# Patient Record
Sex: Female | Born: 1990 | Race: White | Hispanic: No | Marital: Single | Smoking: Never smoker
Health system: Southern US, Community
[De-identification: ages and names within clinical notes are randomized; demographics above are authoritative.]

## PROBLEM LIST (undated history)

## (undated) DIAGNOSIS — K551 Chronic vascular disorders of intestine: Secondary | ICD-10-CM

## (undated) DIAGNOSIS — N289 Disorder of kidney and ureter, unspecified: Secondary | ICD-10-CM

## (undated) DIAGNOSIS — J45909 Unspecified asthma, uncomplicated: Secondary | ICD-10-CM

## (undated) DIAGNOSIS — E079 Disorder of thyroid, unspecified: Secondary | ICD-10-CM

## (undated) DIAGNOSIS — R000 Tachycardia, unspecified: Secondary | ICD-10-CM

## (undated) DIAGNOSIS — I871 Compression of vein: Secondary | ICD-10-CM

## (undated) DIAGNOSIS — I498 Other specified cardiac arrhythmias: Secondary | ICD-10-CM

## (undated) DIAGNOSIS — I951 Orthostatic hypotension: Secondary | ICD-10-CM

## (undated) DIAGNOSIS — R34 Anuria and oliguria: Secondary | ICD-10-CM

## (undated) DIAGNOSIS — N911 Secondary amenorrhea: Secondary | ICD-10-CM

## (undated) DIAGNOSIS — G629 Polyneuropathy, unspecified: Secondary | ICD-10-CM

## (undated) DIAGNOSIS — N9489 Other specified conditions associated with female genital organs and menstrual cycle: Secondary | ICD-10-CM

## (undated) DIAGNOSIS — D649 Anemia, unspecified: Secondary | ICD-10-CM

## (undated) DIAGNOSIS — G90A Postural orthostatic tachycardia syndrome (POTS): Secondary | ICD-10-CM

## (undated) HISTORY — PX: WISDOM TOOTH EXTRACTION: SHX21

## (undated) HISTORY — PX: GASTRIC BYPASS: SHX52

## (undated) HISTORY — PX: GASTROSTOMY W/ FEEDING TUBE: SUR642

## (undated) HISTORY — PX: VASCULAR SURGERY: SHX849

## (undated) HISTORY — PX: NASAL SINUS SURGERY: SHX719

## (undated) HISTORY — PX: CHOLECYSTECTOMY: SHX55

## (undated) HISTORY — PX: TONSILLECTOMY: SUR1361

---
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
## 2011-02-08 ENCOUNTER — Emergency Department (HOSPITAL_BASED_OUTPATIENT_CLINIC_OR_DEPARTMENT_OTHER)
Admission: EM | Admit: 2011-02-08 | Discharge: 2011-02-08 | Disposition: A | Discharge status: Home / Self Care | Payer: BC Managed Care – PPO | Attending: Emergency Medicine | Admitting: Emergency Medicine

## 2011-02-08 DIAGNOSIS — K625 Hemorrhage of anus and rectum: Secondary | ICD-10-CM | POA: Insufficient documentation

## 2011-02-08 DIAGNOSIS — N898 Other specified noninflammatory disorders of vagina: Secondary | ICD-10-CM | POA: Insufficient documentation

## 2011-02-08 DIAGNOSIS — J45909 Unspecified asthma, uncomplicated: Secondary | ICD-10-CM | POA: Insufficient documentation

## 2011-02-08 LAB — PROTIME-INR
INR: 1.15 (ref 0.00–1.49)
Prothrombin Time: 14.9 seconds (ref 11.6–15.2)

## 2011-02-08 LAB — CBC
MCH: 29 pg (ref 26.0–34.0)
MCHC: 33.5 g/dL (ref 30.0–36.0)
RDW: 13 % (ref 11.5–15.5)

## 2011-02-08 LAB — COMPREHENSIVE METABOLIC PANEL
ALT: 20 U/L (ref 0–35)
Alkaline Phosphatase: 148 U/L — ABNORMAL HIGH (ref 39–117)
BUN: 9 mg/dL (ref 6–23)
CO2: 18 mEq/L — ABNORMAL LOW (ref 19–32)
GFR calc Af Amer: >60 mL/min (ref >60)
GFR calc non Af Amer: >60 mL/min (ref >60)
Glucose, Bld: 78 mg/dL (ref 70–99)
Potassium: 3.4 mEq/L — ABNORMAL LOW (ref 3.5–5.1)
Sodium: 141 mEq/L (ref 135–145)
Total Protein: 7.2 g/dL (ref 6.0–8.3)

## 2011-02-08 LAB — URINALYSIS, ROUTINE W REFLEX MICROSCOPIC
Leukocytes, UA: NEGATIVE
Nitrite: NEGATIVE
Specific Gravity, Urine: 1.035 — ABNORMAL HIGH (ref 1.005–1.030)
Urobilinogen, UA: 1 mg/dL (ref 0.0–1.0)
pH: 6 (ref 5.0–8.0)

## 2011-02-08 LAB — URINE MICROSCOPIC-ADD ON

## 2011-02-08 LAB — DIFFERENTIAL
Basophils Absolute: 0 10*3/uL (ref 0.0–0.1)
Basophils Relative: 0 % (ref 0–1)
Eosinophils Relative: 0 % (ref 0–5)
Monocytes Absolute: 0.5 10*3/uL (ref 0.1–1.0)
Monocytes Relative: 7 % (ref 3–12)

## 2011-02-08 LAB — LIPASE, BLOOD: Lipase: 13 U/L (ref 11–59)

## 2011-02-08 LAB — WET PREP, GENITAL: Clue Cells Wet Prep HPF POC: NONE SEEN

## 2011-02-09 LAB — GC/CHLAMYDIA PROBE AMP, GENITAL: Chlamydia, DNA Probe: NEGATIVE

## 2011-04-17 ENCOUNTER — Telehealth (INDEPENDENT_AMBULATORY_CARE_PROVIDER_SITE_OTHER): Payer: Self-pay

## 2011-04-17 NOTE — Telephone Encounter (Signed)
Patients mom called and said he was in severe pain and thinks a stone may be stuck in duct. She said her GI doctors nurse told her to contact us. I told patient we need to be contacted by her GI doctor about needing to be seen. She went on to say that they think all her problems are mentally onset due to tramatic family happenings. Again I told her we will need to get a referral from them or office notes at least to evaluate her. The mother is in [place] and daughter is here in [place] so she is going on by what daughter is telling her over the phone. I told her to tell daughter to go to ER if pain gets worse.

## 2014-08-23 ENCOUNTER — Other Ambulatory Visit (HOSPITAL_COMMUNITY): Payer: Self-pay | Admitting: Family Medicine

## 2014-08-23 DIAGNOSIS — R0781 Pleurodynia: Secondary | ICD-10-CM

## 2014-08-24 ENCOUNTER — Encounter (HOSPITAL_COMMUNITY): Payer: Self-pay

## 2014-08-24 ENCOUNTER — Ambulatory Visit (HOSPITAL_COMMUNITY)
Admission: RE | Admit: 2014-08-24 | Discharge: 2014-08-24 | Disposition: A | Discharge status: Home / Self Care | Payer: Medicaid Other | Source: Ambulatory Visit | Attending: Family Medicine | Admitting: Family Medicine

## 2014-08-24 DIAGNOSIS — Z9884 Bariatric surgery status: Secondary | ICD-10-CM | POA: Insufficient documentation

## 2014-08-24 DIAGNOSIS — R0781 Pleurodynia: Secondary | ICD-10-CM | POA: Insufficient documentation

## 2014-08-24 DIAGNOSIS — Z86718 Personal history of other venous thrombosis and embolism: Secondary | ICD-10-CM | POA: Insufficient documentation

## 2014-08-24 HISTORY — DX: Unspecified asthma, uncomplicated: J45.909

## 2014-08-24 HISTORY — DX: Disorder of kidney and ureter, unspecified: N28.9

## 2014-08-24 MED ORDER — IOHEXOL 350 MG/ML SOLN
80.0000 mL | Freq: Once | INTRAVENOUS | Status: AC | PRN
  Administered 2014-08-24: 80 mL via INTRAVENOUS

## 2014-10-04 ENCOUNTER — Telehealth: Payer: Self-pay | Admitting: Oncology

## 2014-10-04 NOTE — Telephone Encounter (Signed)
pt confirmed appt on 10/21/14 at 1:30pm w/ Cathy Webb Dx:  porphyna and needs iron infusion tx Referring Dr. Ritta Slotaubenschmidt

## 2014-10-06 ENCOUNTER — Telehealth: Payer: Self-pay | Admitting: Oncology

## 2014-10-06 NOTE — Telephone Encounter (Signed)
Called office [place] to [place].  She will fax over office note and labs. 10/06/14 @ 1:40 pm  TG

## 2014-10-20 ENCOUNTER — Telehealth: Payer: Self-pay | Admitting: *Deleted

## 2014-10-20 NOTE — Telephone Encounter (Signed)
Called patient to remind her of tomorrow's new patient appt. Patient tates she wants to cancel, d/t she needs to get medicaid first. Transferred call to scheduling.

## 2014-10-21 ENCOUNTER — Other Ambulatory Visit: Payer: PRIVATE HEALTH INSURANCE

## 2014-10-21 ENCOUNTER — Ambulatory Visit: Payer: PRIVATE HEALTH INSURANCE | Admitting: Oncology

## 2014-10-21 ENCOUNTER — Ambulatory Visit: Payer: PRIVATE HEALTH INSURANCE

## 2014-11-10 ENCOUNTER — Telehealth: Payer: Self-pay | Admitting: Internal Medicine

## 2014-11-10 NOTE — Telephone Encounter (Signed)
CALLED PT AND SCHEDULED NEW PATIENT APPT.    MOHAMED 12/22/14 11 AM  DX: PORPHYRIA REFERRING: DR DAUBENSCHMIDT

## 2014-11-18 ENCOUNTER — Emergency Department (HOSPITAL_COMMUNITY)
Admission: EM | Admit: 2014-11-18 | Discharge: 2014-11-18 | Discharge status: Against Medical Advice | Payer: Medicaid Other | Attending: Emergency Medicine | Admitting: Emergency Medicine

## 2014-11-18 ENCOUNTER — Encounter (HOSPITAL_COMMUNITY): Payer: Self-pay | Admitting: Emergency Medicine

## 2014-11-18 DIAGNOSIS — R1011 Right upper quadrant pain: Secondary | ICD-10-CM | POA: Diagnosis present

## 2014-11-18 DIAGNOSIS — K625 Hemorrhage of anus and rectum: Secondary | ICD-10-CM | POA: Insufficient documentation

## 2014-11-18 DIAGNOSIS — J45909 Unspecified asthma, uncomplicated: Secondary | ICD-10-CM | POA: Insufficient documentation

## 2014-11-18 NOTE — ED Notes (Signed)
Patient was seen at [facility] yesterday for same symptoms-has scheduled CT scan, but has not gone, has not followed up with GI

## 2014-11-18 NOTE — ED Notes (Signed)
Per EMS-states right upper abdominal pain, rectal bleeding for 4 days

## 2014-12-22 ENCOUNTER — Ambulatory Visit: Payer: PRIVATE HEALTH INSURANCE | Admitting: Internal Medicine

## 2014-12-22 ENCOUNTER — Other Ambulatory Visit: Payer: Self-pay | Admitting: Internal Medicine

## 2014-12-22 ENCOUNTER — Other Ambulatory Visit: Payer: PRIVATE HEALTH INSURANCE

## 2014-12-22 ENCOUNTER — Ambulatory Visit: Payer: PRIVATE HEALTH INSURANCE

## 2014-12-22 DIAGNOSIS — Z8639 Personal history of other endocrine, nutritional and metabolic disease: Secondary | ICD-10-CM | POA: Insufficient documentation

## 2015-02-10 ENCOUNTER — Encounter (HOSPITAL_COMMUNITY): Payer: Self-pay

## 2015-02-10 ENCOUNTER — Emergency Department (HOSPITAL_COMMUNITY)
Admission: EM | Admit: 2015-02-10 | Discharge: 2015-02-10 | Disposition: A | Discharge status: Home / Self Care | Payer: Medicaid Other | Attending: Emergency Medicine | Admitting: Emergency Medicine

## 2015-02-10 DIAGNOSIS — Z8679 Personal history of other diseases of the circulatory system: Secondary | ICD-10-CM | POA: Insufficient documentation

## 2015-02-10 DIAGNOSIS — Y9289 Other specified places as the place of occurrence of the external cause: Secondary | ICD-10-CM | POA: Diagnosis not present

## 2015-02-10 DIAGNOSIS — X58XXXA Exposure to other specified factors, initial encounter: Secondary | ICD-10-CM | POA: Diagnosis not present

## 2015-02-10 DIAGNOSIS — J45909 Unspecified asthma, uncomplicated: Secondary | ICD-10-CM | POA: Insufficient documentation

## 2015-02-10 DIAGNOSIS — Z79899 Other long term (current) drug therapy: Secondary | ICD-10-CM | POA: Diagnosis not present

## 2015-02-10 DIAGNOSIS — Z7951 Long term (current) use of inhaled steroids: Secondary | ICD-10-CM | POA: Diagnosis not present

## 2015-02-10 DIAGNOSIS — T7840XA Allergy, unspecified, initial encounter: Secondary | ICD-10-CM | POA: Insufficient documentation

## 2015-02-10 DIAGNOSIS — Y9389 Activity, other specified: Secondary | ICD-10-CM | POA: Diagnosis not present

## 2015-02-10 DIAGNOSIS — Z792 Long term (current) use of antibiotics: Secondary | ICD-10-CM | POA: Diagnosis not present

## 2015-02-10 DIAGNOSIS — Z8639 Personal history of other endocrine, nutritional and metabolic disease: Secondary | ICD-10-CM | POA: Insufficient documentation

## 2015-02-10 DIAGNOSIS — Y998 Other external cause status: Secondary | ICD-10-CM | POA: Diagnosis not present

## 2015-02-10 DIAGNOSIS — T63444A Toxic effect of venom of bees, undetermined, initial encounter: Secondary | ICD-10-CM | POA: Insufficient documentation

## 2015-02-10 DIAGNOSIS — R21 Rash and other nonspecific skin eruption: Secondary | ICD-10-CM | POA: Diagnosis present

## 2015-02-10 DIAGNOSIS — Z8742 Personal history of other diseases of the female genital tract: Secondary | ICD-10-CM | POA: Insufficient documentation

## 2015-02-10 DIAGNOSIS — G629 Polyneuropathy, unspecified: Secondary | ICD-10-CM | POA: Diagnosis not present

## 2015-02-10 HISTORY — DX: Tachycardia, unspecified: R00.0

## 2015-02-10 HISTORY — DX: Polyneuropathy, unspecified: G62.9

## 2015-02-10 HISTORY — DX: Other specified cardiac arrhythmias: I49.8

## 2015-02-10 HISTORY — DX: Secondary amenorrhea: N91.1

## 2015-02-10 HISTORY — DX: Postural orthostatic tachycardia syndrome (POTS): G90.A

## 2015-02-10 HISTORY — DX: Disorder of thyroid, unspecified: E07.9

## 2015-02-10 HISTORY — DX: Anuria and oliguria: R34

## 2015-02-10 HISTORY — DX: Orthostatic hypotension: I95.1

## 2015-02-10 MED ORDER — EPINEPHRINE 0.3 MG/0.3ML IJ SOAJ: 0.3000 mg | Freq: Once | INTRAMUSCULAR | Status: AC

## 2015-02-10 NOTE — ED Notes (Signed)
According to EMS, pt was stung by bee and administered x2 doses of Epi Pen. Fire Dept administered 50mg  of Benadryl. Pt presents to ED Rm in no distress.

## 2015-02-10 NOTE — Discharge Instructions (Signed)

## 2015-02-10 NOTE — ED Notes (Signed)
Bed: FX83 Expected date:  Expected time:  Means of arrival:  Comments: EMS/bee sting/allergic

## 2015-02-10 NOTE — ED Provider Notes (Signed)
CSN: [id number]     Arrival date & time 02/10/15  1956 History   First MD Initiated Contact with Patient 02/10/15 2018     Chief Complaint  Patient presents with  . Insect Bite     (Consider location/radiation/quality/duration/timing/severity/associated sxs/prior Treatment) Patient is a 24 y.o. female presenting with allergic reaction. The history is provided by the patient. No language interpreter was used.  Allergic Reaction Presenting symptoms: itching, rash and swelling   Severity:  Severe Prior allergic episodes:  Insect allergies Context: insect bite/sting   Relieved by:  Antihistamines and epinephrine Worsened by:  Nothing tried Ineffective treatments:  None tried Pt reports she was stung by a bee.   Pt's friend gave her 2 shots of epi.  Fire dept gave pt 50 mg of benadryl.   Pt feels fine now  Past Medical History  Diagnosis Date  . Asthma   . Renal insufficiency   . Polyneuropathy   . Amenorrhea, secondary   . Oliguria   . Thyroid disease   . POTS (postural orthostatic tachycardia syndrome)    Past Surgical History  Procedure Laterality Date  . Gastric bypass     History reviewed. No pertinent family history. History  Substance Use Topics  . Smoking status: Never Smoker   . Smokeless tobacco: Not on file  . Alcohol Use: No   OB History    No data available     Review of Systems  Skin: Positive for itching and rash.  All other systems reviewed and are negative.     Allergies  Bee venom; Dexamethasone; Nsaids; Aspirin; and Adhesive  Home Medications   Prior to Admission medications   Medication Sig Start Date End Date Taking? Authorizing Provider  albuterol (PROVENTIL HFA;VENTOLIN HFA) 108 (90 BASE) MCG/ACT inhaler Inhale 1 puff into the lungs every 6 (six) hours as needed for wheezing or shortness of breath.  08/23/14 08/24/15 Yes Historical Provider, MD  B Complex Vitamins (VITAMIN-B COMPLEX) TABS Take 1 tablet by mouth daily.   Yes Historical  Provider, MD  cetirizine (ZYRTEC) 10 MG tablet Take 10 mg by mouth daily. 08/23/14  Yes Historical Provider, MD  cyanocobalamin (,VITAMIN B-12,) 1000 MCG/ML injection Inject 1,000 mcg into the muscle every 30 (thirty) days. 11/14/14  Yes Historical Provider, MD  diphenhydrAMINE (BENADRYL) 25 MG tablet Take 50 mg by mouth every 4 (four) hours as needed (liver enzymes).  08/23/13  Yes Historical Provider, MD  doxycycline (VIBRA-TABS) 100 MG tablet Take 100 mg by mouth daily. 04/24/14  Yes Historical Provider, MD  fluconazole (DIFLUCAN) 150 MG tablet Take 150 mg by mouth daily. 04/19/14  Yes Historical Provider, MD  fluticasone (FLONASE) 50 MCG/ACT nasal spray Place 1 spray into the nose daily. 08/23/14  Yes Historical Provider, MD  gabapentin (NEURONTIN) 800 MG tablet Take 800 mg by mouth 4 (four) times daily as needed (leg pain).  11/14/14 02/12/15 Yes Historical Provider, MD  HYDROcodone-acetaminophen (NORCO/VICODIN) 5-325 MG per tablet Take 1 tablet by mouth every 4 (four) hours as needed for moderate pain or severe pain.  12/31/14  Yes Historical Provider, MD  hydrOXYzine (ATARAX/VISTARIL) 10 MG tablet Take 10 mg by mouth 4 (four) times daily as needed for itching, anxiety or nausea.  08/23/14  Yes Historical Provider, MD  lactulose (CHRONULAC) 10 GM/15ML solution Take 20 g by mouth daily as needed for mild constipation or moderate constipation.  08/23/14  Yes Historical Provider, MD  Multiple Vitamin (THERA) TABS Take 1 tablet by mouth daily.  Yes Historical Provider, MD  mupirocin ointment (BACTROBAN) 2 % Apply 1 application topically as needed (skin infection).  09/19/13  Yes Historical Provider, MD  nitrofurantoin, macrocrystal-monohydrate, (MACROBID) 100 MG capsule Take 100 mg by mouth daily. 08/23/14  Yes Historical Provider, MD  ondansetron (ZOFRAN) 4 MG tablet Take 4 mg by mouth every 8 (eight) hours as needed for nausea or vomiting.  08/23/14  Yes Historical Provider, MD  Pancrelipase, Lip-Prot-Amyl, 24000 UNITS  CPEP Take 86,000 units of lipase by mouth 3 (three) times daily. 08/23/14  Yes Historical Provider, MD  promethazine (PHENERGAN) 25 MG suppository Place 25 mg rectally every 6 (six) hours as needed for nausea or vomiting.  08/23/14  Yes Historical Provider, MD  promethazine (PHENERGAN) 25 MG tablet Take 25 mg by mouth every 8 (eight) hours as needed for nausea or vomiting.  08/23/14  Yes Historical Provider, MD  promethazine (PHENERGAN) 6.25 MG/5ML syrup Take 6.25 mg by mouth 4 (four) times daily as needed for nausea or vomiting.  08/26/14  Yes Historical Provider, MD  Vitamin D, Ergocalciferol, (DRISDOL) 50000 UNITS CAPS capsule Take 50,000 Units by mouth every Wednesday. 10/09/12  Yes Historical Provider, MD  zolpidem (AMBIEN) 10 MG tablet Take 10 mg by mouth daily as needed for sleep.  11/14/14  Yes Historical Provider, MD   BP 110/67 mmHg  Pulse 85  Temp(Src) 98.5 F (36.9 C) (Oral)  Resp 12  SpO2 100% Physical Exam  Constitutional: She is oriented to person, place, and time. She appears well-developed and well-nourished.  HENT:  Head: Normocephalic and atraumatic.  Eyes: Conjunctivae and EOM are normal. Pupils are equal, round, and reactive to light.  Neck: Normal range of motion.  Cardiovascular: Normal rate and normal heart sounds.   Pulmonary/Chest: Effort normal.  Abdominal: Soft. She exhibits no distension.  Musculoskeletal: Normal range of motion.  Neurological: She is alert and oriented to person, place, and time.  Skin: Skin is warm. No rash noted. No erythema.  Psychiatric: She has a normal mood and affect.  Nursing note and vitals reviewed.   ED Course  Procedures (including critical care time) Labs Review Labs Reviewed - No data to display  Imaging Review No results found.   EKG Interpretation None      MDM   Pt observed x 3.5 hours.  Pt had epi 4 hours ago.   Pt advised to continue benadryl and given rx for refill on epi pen.   Final diagnoses:  Bee sting,  undetermined intent, initial encounter  Allergic reaction to bee sting, undetermined intent, initial encounter    avs    Elson Areas, PA-C 02/11/15 0115  Donnetta Hutching, MD 02/11/15 1556

## 2015-02-10 NOTE — ED Notes (Signed)
Pt requesting to be discharged. Pt states she feels much better and can swallow without any difficulty.

## 2015-05-16 ENCOUNTER — Emergency Department (HOSPITAL_BASED_OUTPATIENT_CLINIC_OR_DEPARTMENT_OTHER): Payer: Medicaid Other

## 2015-05-16 ENCOUNTER — Emergency Department (HOSPITAL_BASED_OUTPATIENT_CLINIC_OR_DEPARTMENT_OTHER)
Admission: EM | Admit: 2015-05-16 | Discharge: 2015-05-16 | Disposition: A | Discharge status: Other Acute Inpatient Hospital | Payer: Medicaid Other | Attending: Emergency Medicine | Admitting: Emergency Medicine

## 2015-05-16 ENCOUNTER — Encounter (HOSPITAL_BASED_OUTPATIENT_CLINIC_OR_DEPARTMENT_OTHER): Payer: Self-pay | Admitting: *Deleted

## 2015-05-16 DIAGNOSIS — G629 Polyneuropathy, unspecified: Secondary | ICD-10-CM | POA: Insufficient documentation

## 2015-05-16 DIAGNOSIS — J45909 Unspecified asthma, uncomplicated: Secondary | ICD-10-CM | POA: Insufficient documentation

## 2015-05-16 DIAGNOSIS — R000 Tachycardia, unspecified: Secondary | ICD-10-CM | POA: Insufficient documentation

## 2015-05-16 DIAGNOSIS — Z792 Long term (current) use of antibiotics: Secondary | ICD-10-CM | POA: Diagnosis not present

## 2015-05-16 DIAGNOSIS — Z8639 Personal history of other endocrine, nutritional and metabolic disease: Secondary | ICD-10-CM | POA: Insufficient documentation

## 2015-05-16 DIAGNOSIS — R519 Headache, unspecified: Secondary | ICD-10-CM

## 2015-05-16 DIAGNOSIS — Z7951 Long term (current) use of inhaled steroids: Secondary | ICD-10-CM | POA: Diagnosis not present

## 2015-05-16 DIAGNOSIS — Z8679 Personal history of other diseases of the circulatory system: Secondary | ICD-10-CM | POA: Insufficient documentation

## 2015-05-16 DIAGNOSIS — Z87448 Personal history of other diseases of urinary system: Secondary | ICD-10-CM | POA: Insufficient documentation

## 2015-05-16 DIAGNOSIS — R1084 Generalized abdominal pain: Secondary | ICD-10-CM | POA: Insufficient documentation

## 2015-05-16 DIAGNOSIS — R231 Pallor: Secondary | ICD-10-CM | POA: Diagnosis not present

## 2015-05-16 DIAGNOSIS — Z3202 Encounter for pregnancy test, result negative: Secondary | ICD-10-CM | POA: Diagnosis not present

## 2015-05-16 DIAGNOSIS — R0789 Other chest pain: Secondary | ICD-10-CM | POA: Diagnosis not present

## 2015-05-16 DIAGNOSIS — Z8742 Personal history of other diseases of the female genital tract: Secondary | ICD-10-CM | POA: Diagnosis not present

## 2015-05-16 DIAGNOSIS — Z79899 Other long term (current) drug therapy: Secondary | ICD-10-CM | POA: Diagnosis not present

## 2015-05-16 DIAGNOSIS — R51 Headache: Secondary | ICD-10-CM | POA: Insufficient documentation

## 2015-05-16 HISTORY — DX: Other specified conditions associated with female genital organs and menstrual cycle: N94.89

## 2015-05-16 HISTORY — DX: Compression of vein: I87.1

## 2015-05-16 HISTORY — DX: Anemia, unspecified: D64.9

## 2015-05-16 HISTORY — DX: Polyneuropathy, unspecified: G62.9

## 2015-05-16 HISTORY — DX: Unspecified porphyria: E80.20

## 2015-05-16 HISTORY — DX: Chronic vascular disorders of intestine: K55.1

## 2015-05-16 LAB — COMPREHENSIVE METABOLIC PANEL
ALT: 20 U/L (ref 14–54)
AST: 18 U/L (ref 15–41)
Albumin: 4.1 g/dL (ref 3.5–5.0)
Alkaline Phosphatase: 85 U/L (ref 38–126)
Anion gap: 6 (ref 5–15)
BUN: 11 mg/dL (ref 6–20)
CHLORIDE: 108 mmol/L (ref 101–111)
CO2: 25 mmol/L (ref 22–32)
CREATININE: 0.88 mg/dL (ref 0.44–1.00)
Calcium: 8.6 mg/dL — ABNORMAL LOW (ref 8.9–10.3)
GFR calc Af Amer: >60 mL/min (ref >60)
Glucose, Bld: 87 mg/dL (ref 65–99)
POTASSIUM: 3.9 mmol/L (ref 3.5–5.1)
SODIUM: 139 mmol/L (ref 135–145)
Total Bilirubin: 0.2 mg/dL — ABNORMAL LOW (ref 0.3–1.2)
Total Protein: 7.1 g/dL (ref 6.5–8.1)

## 2015-05-16 LAB — CBC WITH DIFFERENTIAL/PLATELET
BASOS ABS: 0.1 10*3/uL (ref 0.0–0.1)
Basophils Relative: 1 %
EOS ABS: 0.1 10*3/uL (ref 0.0–0.7)
EOS PCT: 2 %
HCT: 42.2 % (ref 36.0–46.0)
Hemoglobin: 13.4 g/dL (ref 12.0–15.0)
Lymphocytes Relative: 30 %
Lymphs Abs: 2.1 10*3/uL (ref 0.7–4.0)
MCH: 30.8 pg (ref 26.0–34.0)
MCHC: 31.8 g/dL (ref 30.0–36.0)
MCV: 97 fL (ref 78.0–100.0)
MONO ABS: 0.4 10*3/uL (ref 0.1–1.0)
Monocytes Relative: 5 %
Neutro Abs: 4.4 10*3/uL (ref 1.7–7.7)
Neutrophils Relative %: 62 %
PLATELETS: 261 10*3/uL (ref 150–400)
RBC: 4.35 MIL/uL (ref 3.87–5.11)
RDW: 12.9 % (ref 11.5–15.5)
WBC: 7 10*3/uL (ref 4.0–10.5)

## 2015-05-16 LAB — URINALYSIS, ROUTINE W REFLEX MICROSCOPIC
BILIRUBIN URINE: NEGATIVE
Glucose, UA: NEGATIVE mg/dL
Hgb urine dipstick: NEGATIVE
KETONES UR: NEGATIVE mg/dL
NITRITE: NEGATIVE
Protein, ur: NEGATIVE mg/dL
Specific Gravity, Urine: 1.025 (ref 1.005–1.030)
Urobilinogen, UA: 0.2 mg/dL (ref 0.0–1.0)
pH: 6 (ref 5.0–8.0)

## 2015-05-16 LAB — URINE MICROSCOPIC-ADD ON

## 2015-05-16 LAB — LIPASE, BLOOD: LIPASE: 18 U/L — AB (ref 22–51)

## 2015-05-16 LAB — PREGNANCY, URINE: PREG TEST UR: NEGATIVE

## 2015-05-16 LAB — TROPONIN I: Troponin I: <0.03 ng/mL (ref <0.031)

## 2015-05-16 LAB — BRAIN NATRIURETIC PEPTIDE: B Natriuretic Peptide: 21.6 pg/mL (ref 0.0–100.0)

## 2015-05-16 MED ORDER — MORPHINE SULFATE (PF) 4 MG/ML IV SOLN
4.0000 mg | Freq: Once | INTRAVENOUS | Status: AC
  Administered 2015-05-16: 4 mg via INTRAVENOUS
  Filled 2015-05-16: qty 1

## 2015-05-16 MED ORDER — HYDROMORPHONE HCL 1 MG/ML IJ SOLN
0.5000 mg | Freq: Once | INTRAMUSCULAR | Status: AC
  Administered 2015-05-16: 0.5 mg via INTRAVENOUS
  Filled 2015-05-16: qty 1

## 2015-05-16 MED ORDER — ONDANSETRON HCL 4 MG/2ML IJ SOLN
4.0000 mg | Freq: Four times a day (QID) | INTRAMUSCULAR | Status: DC | PRN
  Administered 2015-05-16: 4 mg via INTRAVENOUS
  Filled 2015-05-16: qty 2

## 2015-05-16 MED ORDER — SODIUM CHLORIDE 0.9 % IV BOLUS (SEPSIS)
1000.0000 mL | Freq: Once | INTRAVENOUS | Status: AC
  Administered 2015-05-16: 1000 mL via INTRAVENOUS

## 2015-05-16 MED ORDER — SODIUM CHLORIDE 0.9 % IV SOLN
INTRAVENOUS | Status: DC
  Administered 2015-05-16: 19:00:00 via INTRAVENOUS

## 2015-05-16 MED ORDER — MORPHINE SULFATE (PF) 4 MG/ML IV SOLN
4.0000 mg | INTRAVENOUS | Status: DC | PRN
  Administered 2015-05-16: 4 mg via INTRAVENOUS
  Filled 2015-05-16: qty 1

## 2015-05-16 NOTE — ED Notes (Signed)
Pt reports cramping in left hand and left side of her face x 20 minutes. States this is a new symptom for her. Greta Doom, EDPA notified

## 2015-05-16 NOTE — ED Provider Notes (Signed)
CSN: [id number]     Arrival date & time 05/16/15  1205 History   First MD Initiated Contact with Patient 05/16/15 1209     Chief Complaint  Patient presents with  . Abdominal Pain     (Consider location/radiation/quality/duration/timing/severity/associated sxs/prior Treatment) HPI  24 year old female with extensive past medical history including pots, thyroid disease, porphyria, asthma, renal insufficiency, polyneuropathy, amenorrhea, May-Thurner Syndrome who presents for evaluation of abdominal pain. Patient reports 2 weeks ago she noticed that both of her hands were blue. She was seen and evaluate for this and states that she was ruled out for blood clots in her chest and heart. Since then she has had generalized fatigue. For the past 2-3 days she developed upper abdominal pain which she described as a sharp and stabbing constant sensation as well as pressure in his chest. Symptoms worsen with walking, she endorse dyspnea on exertion, having cough productive with frothy sputum and occasional trace of blood. Reports decreased appetite, generalized fatigue, feeling nauseous without vomiting.  She reported symptoms moderate in severity. She reports feeling hot but denies any specific fever or chills. Her last menstrual period was 3 years ago. she reported taking Advil with minimal improvement. She did discussed the symptoms with her on-call hematologist 2 days ago and was recommended to come to the ER for further evaluation. 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 Also mentioned history of anemia requiring iron transfusion every 3 months however the last transfusion was 6 months ago. She is currently living [facility] and receiving care through [facility], and at [facility]. She has not established hematologist in town.      Past Medical History  Diagnosis Date  . Asthma   . Renal insufficiency   . Polyneuropathy   .  Amenorrhea, secondary   . Oliguria   . Thyroid disease   . POTS (postural orthostatic tachycardia syndrome)    Past Surgical History  Procedure Laterality Date  . Gastric bypass     No family history on file. Social History  Substance Use Topics  . Smoking status: Never Smoker   . Smokeless tobacco: None  . Alcohol Use: No   OB History    No data available     Review of Systems     Allergies  Bee venom; Dexamethasone; Nsaids; Aspirin; and Adhesive  Home Medications   Prior to Admission medications   Medication Sig Start Date End Date Taking? Authorizing Provider  albuterol (PROVENTIL HFA;VENTOLIN HFA) 108 (90 BASE) MCG/ACT inhaler Inhale 1 puff into the lungs every 6 (six) hours as needed for wheezing or shortness of breath.  08/23/14 08/24/15  Historical Provider, MD  B Complex Vitamins (VITAMIN-B COMPLEX) TABS Take 1 tablet by mouth daily.    Historical Provider, MD  cetirizine (ZYRTEC) 10 MG tablet Take 10 mg by mouth daily. 08/23/14   Historical Provider, MD  cyanocobalamin (,VITAMIN B-12,) 1000 MCG/ML injection Inject 1,000 mcg into the muscle every 30 (thirty) days. 11/14/14   Historical Provider, MD  diphenhydrAMINE (BENADRYL) 25 MG tablet Take 50 mg by mouth every 4 (four) hours as needed (liver enzymes).  08/23/13   Historical Provider, MD  doxycycline (VIBRA-TABS) 100 MG tablet Take 100 mg by mouth daily. 04/24/14   Historical Provider, MD  EPINEPHrine 0.3 mg/0.3 mL IJ SOAJ injection Inject 0.3 mLs (0.3 mg total) into the muscle once. 02/10/15   Elson Areas, PA-C  fluconazole (DIFLUCAN) 150 MG  tablet Take 150 mg by mouth daily. 04/19/14   Historical Provider, MD  fluticasone (FLONASE) 50 MCG/ACT nasal spray Place 1 spray into the nose daily. 08/23/14   Historical Provider, MD  gabapentin (NEURONTIN) 800 MG tablet Take 800 mg by mouth 4 (four) times daily as needed (leg pain).  11/14/14 02/12/15  Historical Provider, MD  HYDROcodone-acetaminophen (NORCO/VICODIN) 5-325 MG per tablet  Take 1 tablet by mouth every 4 (four) hours as needed for moderate pain or severe pain.  12/31/14   Historical Provider, MD  hydrOXYzine (ATARAX/VISTARIL) 10 MG tablet Take 10 mg by mouth 4 (four) times daily as needed for itching, anxiety or nausea.  08/23/14   Historical Provider, MD  lactulose (CHRONULAC) 10 GM/15ML solution Take 20 g by mouth daily as needed for mild constipation or moderate constipation.  08/23/14   Historical Provider, MD  Multiple Vitamin (THERA) TABS Take 1 tablet by mouth daily.    Historical Provider, MD  mupirocin ointment (BACTROBAN) 2 % Apply 1 application topically as needed (skin infection).  09/19/13   Historical Provider, MD  nitrofurantoin, macrocrystal-monohydrate, (MACROBID) 100 MG capsule Take 100 mg by mouth daily. 08/23/14   Historical Provider, MD  ondansetron (ZOFRAN) 4 MG tablet Take 4 mg by mouth every 8 (eight) hours as needed for nausea or vomiting.  08/23/14   Historical Provider, MD  Pancrelipase, Lip-Prot-Amyl, 24000 UNITS CPEP Take 86,000 units of lipase by mouth 3 (three) times daily. 08/23/14   Historical Provider, MD  promethazine (PHENERGAN) 25 MG suppository Place 25 mg rectally every 6 (six) hours as needed for nausea or vomiting.  08/23/14   Historical Provider, MD  promethazine (PHENERGAN) 25 MG tablet Take 25 mg by mouth every 8 (eight) hours as needed for nausea or vomiting.  08/23/14   Historical Provider, MD  promethazine (PHENERGAN) 6.25 MG/5ML syrup Take 6.25 mg by mouth 4 (four) times daily as needed for nausea or vomiting.  08/26/14   Historical Provider, MD  Vitamin D, Ergocalciferol, (DRISDOL) 50000 UNITS CAPS capsule Take 50,000 Units by mouth every Wednesday. 10/09/12   Historical Provider, MD  zolpidem (AMBIEN) 10 MG tablet Take 10 mg by mouth daily as needed for sleep.  11/14/14   Historical Provider, MD   BP 131/97 mmHg  Pulse 130  Temp(Src) 98.5 F (36.9 C) (Oral)  Resp 22  Ht  (1.626 m)  Wt 203 lb (92.08 kg)  BMI 34.83 kg/m2  SpO2  100% Physical Exam  Constitutional: She is oriented to person, place, and time. She appears well-developed and well-nourished. No distress.  Moderately obese Caucasian female appears to be in mild discomfort.   HENT:  Head: Atraumatic.  Mouth/Throat: Oropharynx is clear and moist.  Eyes: Conjunctivae and EOM are normal. Pupils are equal, round, and reactive to light.  Neck: Neck supple. No JVD present.  Cardiovascular: Intact distal pulses.   Pulses:      Radial pulses are 2+ on the right side, and 2+ on the left side.       Dorsalis pedis pulses are 2+ on the right side, and 2+ on the left side.  Tachycardia without murmurs rubs or gallops  Pulmonary/Chest: She exhibits tenderness (Mild diffuse chest wall tenderness.).  Tachypneic and tachycardic no wheezes rhonchi also rales heard  Abdominal: Soft. There is no tenderness.  Mild diffuse abdominal tenderness without focal point tenderness.  Musculoskeletal: She exhibits no edema.  Neurological: She is alert and oriented to person, place, and time. She has normal strength. No  cranial nerve deficit or sensory deficit. She displays a negative Romberg sign. GCS eye subscore is 4. GCS verbal subscore is 5. GCS motor subscore is 6.  Skin: No rash noted. There is pallor.  Psychiatric: She has a normal mood and affect.  Nursing note and vitals reviewed.   ED Course  Procedures (including critical care time)  Patient with multiple comorbidities along with history of coagulopathy presenting for upper abdominal pain, chest pain, and generalized fatigue. Recurrent sxs, with similar ER visit 2 weeks ago without acute finding.      2:37 PM Pt now report headache, with tingling sensation to R side of face and bilateral arms.  Has been c/o intermittent migraine headache for the past several day. Family concern for stroke.  WIll obtain head CT scan.  I have reviewed prior notes from Dr. Jolyne Loa who documented that he has not diagnosed her with  Porphyria at this time.  He is currently awaits further consultation with a Porphyria expect from [place].     2:46 PM Patient now reports subjective paresthesia to the right side of her face, as well as decreased sensation to bilateral upper and lower extremities. She also endorses sharp achy headache. Her labs are reassuring. No evidence of anemia with a hemoglobin of 13.4. Normal WBC therefore low suspicion for acute infection. Her electrolytes are reassuring. Normal lipase. Her EKG and troponins are unremarkable. BNP is normal. Her urine shows no signs of urinary tract infection a chest x-ray is normal. She has intact distal pulse to all 4 extremities.  3:57 PM Appreciate consultation from [facility] Dr. Jolyne Loa who has reviewed pt's chart.  He's unsure if pt has a confirmed diagnosis of Porphyria but given that pt is still having abd pain and symptomatic, the plan is to have pt transfer to [facility] for admission so additional work up can be perform.  Pt did had recent chest CTA < 2weeks ago for similar complaint without evidence of PE.  At this time she is not hypoxic and her vital sign is stable therefore pt is stable to transfer and additional advance imaging to r/o venous pathology can be determine by the admitting team.  Pt agrees with plan.  Will work on transferring protocol.  Care discussed with Dr. Donnald Garre.    4:26 PM Appreciate consultation from [facility] hospitalist DR. Lucianne Muss.  Plan to have pt admit for further work up of acute porphyria, which may include dextro loading, IVF, pain control. Pt will be transferred to [facility].    Labs Review Labs Reviewed  URINALYSIS, ROUTINE W REFLEX MICROSCOPIC (NOT AT [facility]) - Abnormal; Notable for the following:    APPearance CLOUDY (*)    Leukocytes, UA SMALL (*)    All other components within normal limits  COMPREHENSIVE METABOLIC PANEL - Abnormal; Notable for the following:    Calcium 8.6 (*)    Total Bilirubin 0.2 (*)    All  other components within normal limits  LIPASE, BLOOD - Abnormal; Notable for the following:    Lipase 18 (*)    All other components within normal limits  URINE MICROSCOPIC-ADD ON - Abnormal; Notable for the following:    Squamous Epithelial / LPF FEW (*)    Bacteria, UA FEW (*)    All other components within normal limits  PREGNANCY, URINE  CBC WITH DIFFERENTIAL/PLATELET  TROPONIN I  BRAIN NATRIURETIC PEPTIDE    Imaging Review Dg Chest 2 View  05/16/2015   CLINICAL DATA:  Cough, shortness of breath and  chest pain for 2 days. Initial encounter.  EXAM: CHEST  2 VIEW  COMPARISON:  CT chest 08/24/2014.  FINDINGS: Heart size and mediastinal contours are within normal limits. Both lungs are clear. Visualized skeletal structures are unremarkable.  IMPRESSION: Negative exam   Electronically Signed   By: Drusilla Kanner M.D.   On: 05/16/2015 13:16   Ct Head Wo Contrast  05/16/2015   CLINICAL DATA:  Abdominal pain. Left hand cramping. Right facial numbness. Symptoms today.  EXAM: CT HEAD WITHOUT CONTRAST  TECHNIQUE: Contiguous axial images were obtained from the base of the skull through the vertex without intravenous contrast.  COMPARISON:  None.  FINDINGS: The brain appears normal without hemorrhage, infarct, mass lesion, mass effect, midline shift or abnormal extra-axial fluid collection. No hydrocephalus or pneumocephalus. The calvarium is intact. Imaged paranasal sinuses and mastoid air cells are clear.  IMPRESSION: Negative head CT.   Electronically Signed   By: Drusilla Kanner M.D.   On: 05/16/2015 15:07   I have personally reviewed and evaluated these images and lab results as part of my medical decision-making.   EKG Interpretation None      Date: 05/16/2015  Rate: 97   Rhythm: normal sinus rhythm  QRS Axis: normal  Intervals: normal  ST/T Wave abnormalities: normal  Conduction Disutrbances: none  Narrative Interpretation:   Old EKG Reviewed: No significant changes  noted     MDM   Final diagnoses:  Generalized abdominal pain  Acute nonintractable headache, unspecified headache type  H/O porphyria    BP 122/76 mmHg  Pulse 86  Temp(Src) 98.5 F (36.9 C) (Oral)  Resp 18  Ht  (1.626 m)  Wt 203 lb (92.08 kg)  BMI 34.83 kg/m2  SpO2 97%  I have reviewed nursing notes and vital signs. I personally viewed the imaging tests through PACS system and agrees with radiologist's intepretation I reviewed available ER/hospitalization records through the EMR     Fayrene Helper, PA-C 05/16/15 1634  Arby Barrette, MD 05/17/15 2002

## 2015-05-16 NOTE — ED Notes (Signed)
[facility] is transferring patient to [facility] [phone]

## 2015-05-16 NOTE — ED Notes (Signed)
[facility] for  Dr. Amparo Bristol for Dr. Verdie Mosher.   Will call him back on [phone]

## 2015-05-16 NOTE — ED Notes (Signed)
Patient placed on cardiac monitor. Patient placed on vitals q30.

## 2015-05-16 NOTE — ED Notes (Addendum)
Abdominal pain. States she has had blue skin for 2 weeks. Her MD told her to come here for evaluation due to recent abnormal lab results.

## 2015-05-16 NOTE — ED Notes (Signed)
Spoke with pt's father Evanee Lubrano by phone per pt's request to notify of admission to [facility]

## 2015-05-16 NOTE — ED Notes (Signed)
Patient transported to X-ray and returned. Ambulates with steady gait

## 2015-10-06 IMAGING — CT CT HEAD W/O CM
1 of 2 series · 16 of 30 positions shown, 20 images · non-contrast
Comparison: None.

CLINICAL DATA: Abdominal pain. Left hand cramping. Right facial
numbness. Symptoms today.

EXAM:
CT HEAD WITHOUT CONTRAST
TECHNIQUE: Contiguous axial images were obtained from the base of the skull
through the vertex without intravenous contrast.

[Series 2: head 4.8 h37s · axial · 0.45mm/px · z∈[-75,+58]mm · 16 of 32 slices shown, 20 images]
[im 2/32  brain]
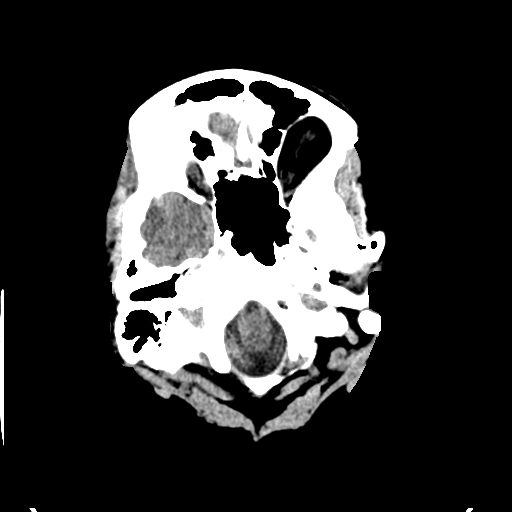
[im 2/32  bone]
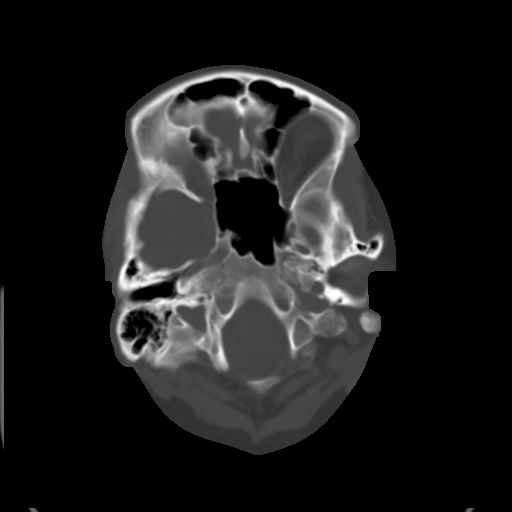
[im 5/32  brain]
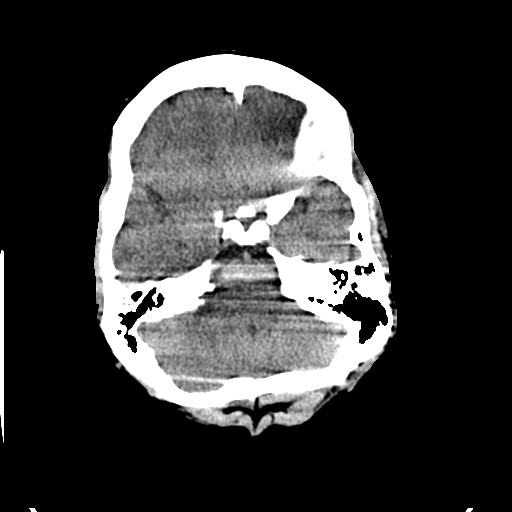
[im 6/32  brain]
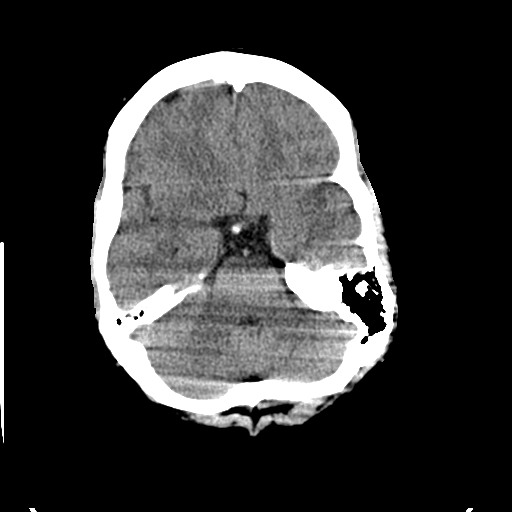
[im 7/32  brain]
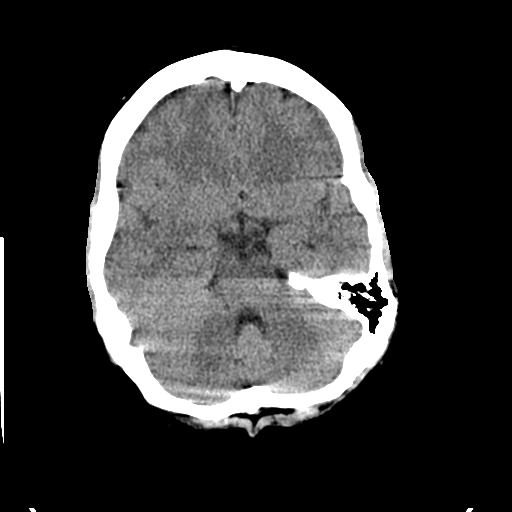
[im 10/32  brain]
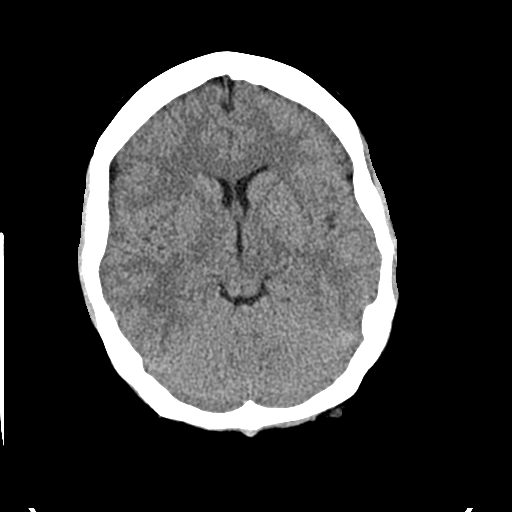
[im 10/32  bone]
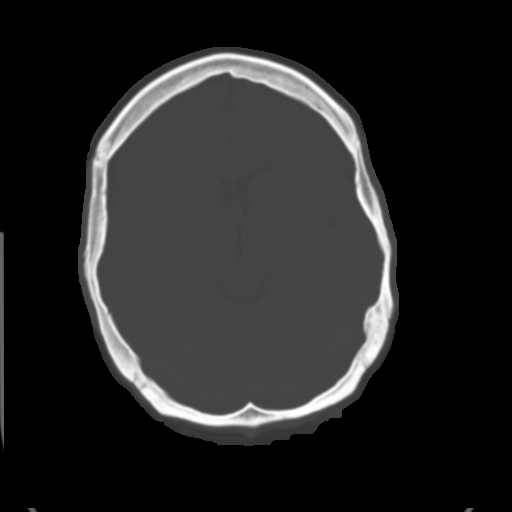
[im 11/32  brain]
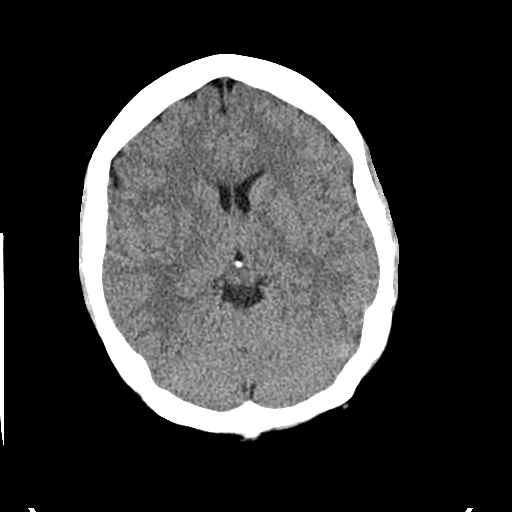
[im 13/32  brain]
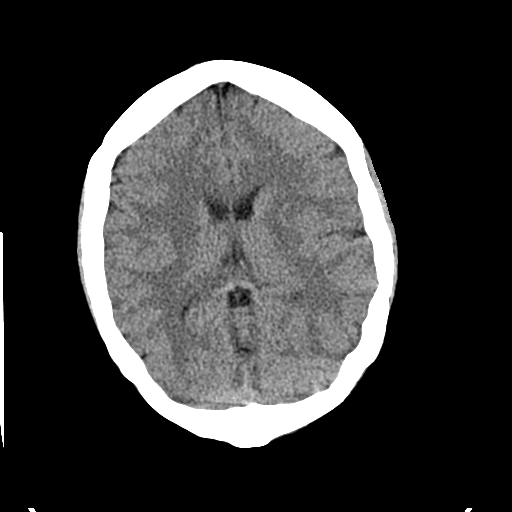
[im 15/32  brain]
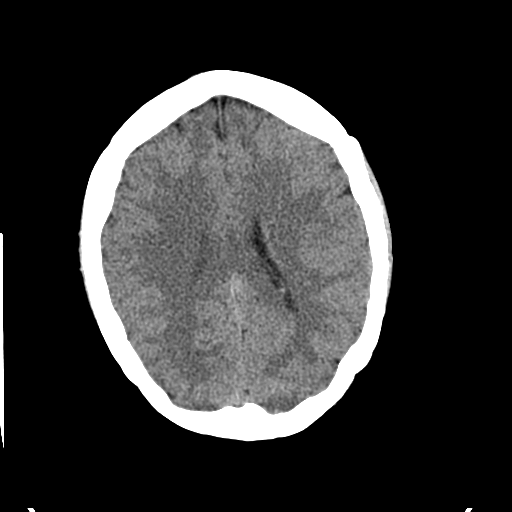
[im 17/32  brain]
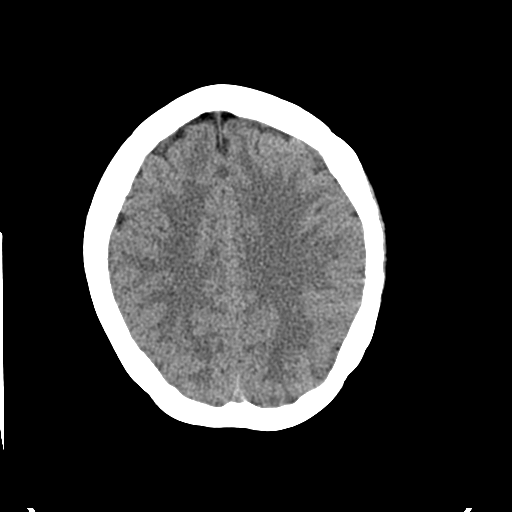
[im 17/32  bone]
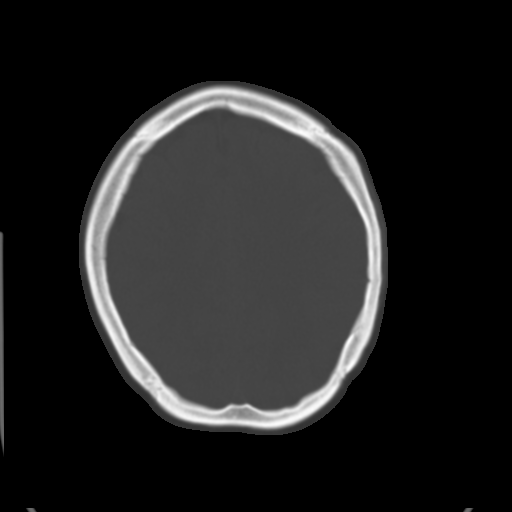
[im 19/32  brain]
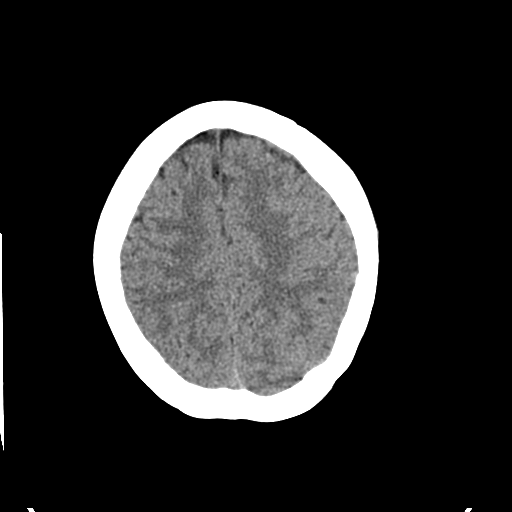
[im 21/32  brain]
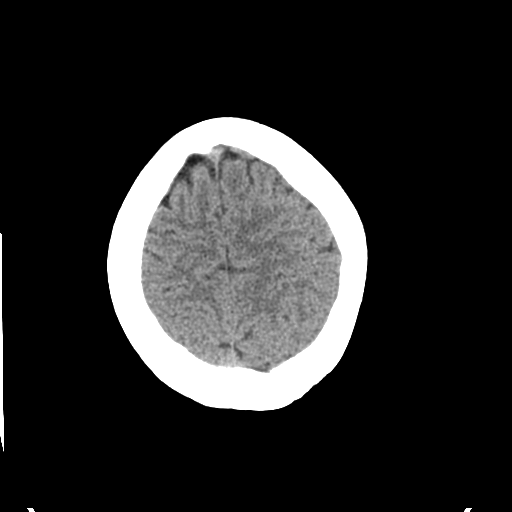
[im 22/32  brain]
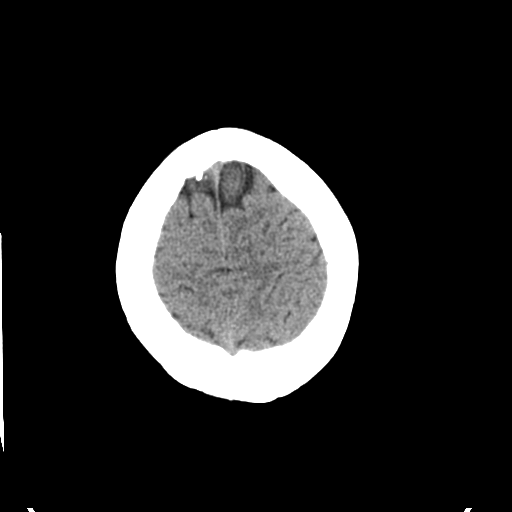
[im 25/32  brain]
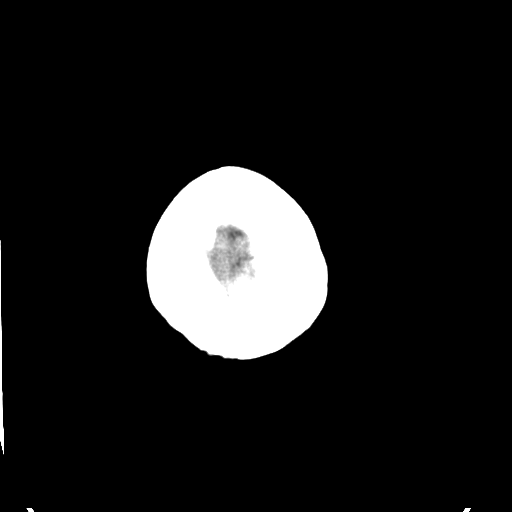
[im 25/32  bone]
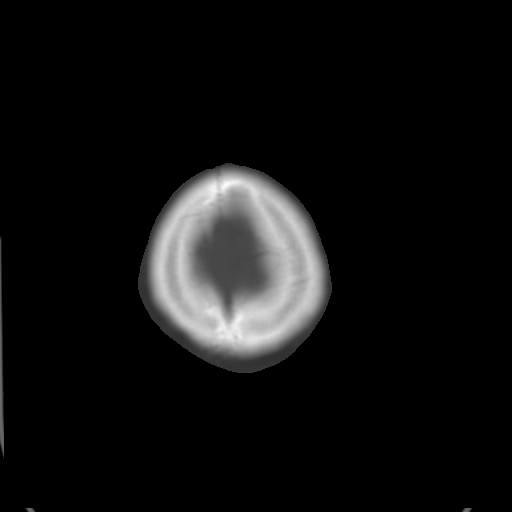
[im 26/32  brain]
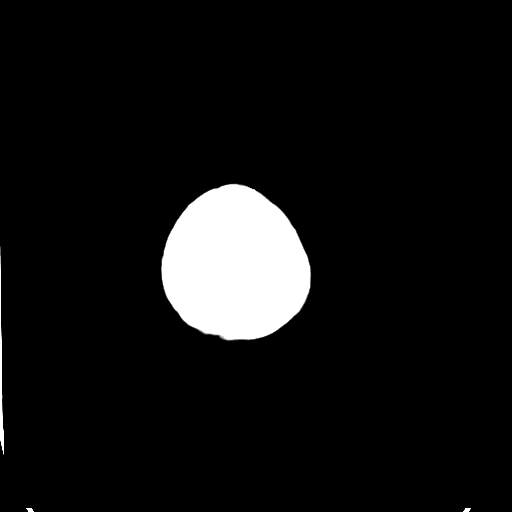
[im 27/32  brain]
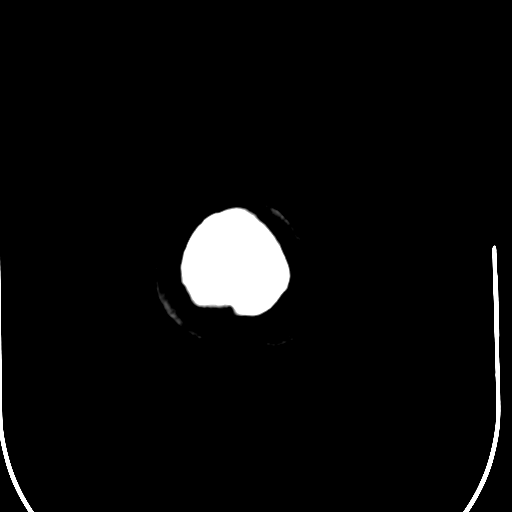
[im 30/32  brain]
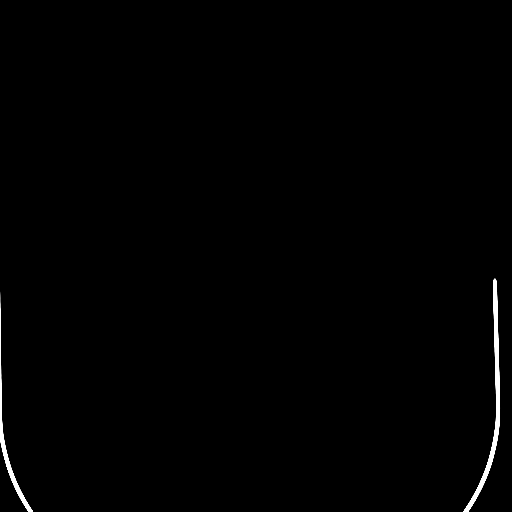

[16 of 30 positions shown; findings below may reference images not displayed]

FINDINGS: The brain appears normal without hemorrhage, infarct, mass lesion,
mass effect, midline shift or abnormal extra-axial fluid collection.
No hydrocephalus or pneumocephalus. The calvarium is intact. Imaged
paranasal sinuses and mastoid air cells are clear.
IMPRESSION: Negative head CT.
